# Patient Record
Sex: Male | Born: 1955 | Race: White | Hispanic: No | Marital: Married | Smoking: Former smoker
Health system: Southern US, Community
[De-identification: ages and names within clinical notes are randomized; demographics above are authoritative.]

## PROBLEM LIST (undated history)

## (undated) DIAGNOSIS — C801 Malignant (primary) neoplasm, unspecified: Secondary | ICD-10-CM

## (undated) DIAGNOSIS — R001 Bradycardia, unspecified: Secondary | ICD-10-CM

## (undated) HISTORY — PX: URETER SURGERY: SHX823

---

## 1998-06-24 ENCOUNTER — Observation Stay (HOSPITAL_COMMUNITY): Admission: AD | Admit: 1998-06-24 | Discharge: 1998-06-25 | Payer: Self-pay | Admitting: Urology

## 1998-07-06 ENCOUNTER — Inpatient Hospital Stay (HOSPITAL_COMMUNITY): Admission: RE | Admit: 1998-07-06 | Discharge: 1998-07-09 | Payer: Self-pay | Admitting: Urology

## 1998-09-15 ENCOUNTER — Encounter: Admission: RE | Admit: 1998-09-15 | Discharge: 1998-09-15 | Payer: Self-pay | Admitting: *Deleted

## 1998-12-29 ENCOUNTER — Encounter: Payer: Self-pay | Admitting: Urology

## 1998-12-29 ENCOUNTER — Ambulatory Visit (HOSPITAL_COMMUNITY): Admission: RE | Admit: 1998-12-29 | Discharge: 1998-12-29 | Payer: Self-pay | Admitting: Urology

## 2000-02-07 ENCOUNTER — Encounter: Payer: Self-pay | Admitting: Urology

## 2000-02-07 ENCOUNTER — Ambulatory Visit (HOSPITAL_COMMUNITY): Admission: RE | Admit: 2000-02-07 | Discharge: 2000-02-07 | Payer: Self-pay | Admitting: Urology

## 2001-10-24 ENCOUNTER — Inpatient Hospital Stay (HOSPITAL_COMMUNITY): Admission: RE | Admit: 2001-10-24 | Discharge: 2001-10-25 | Payer: Self-pay | Admitting: Neurosurgery

## 2003-12-19 ENCOUNTER — Ambulatory Visit (HOSPITAL_BASED_OUTPATIENT_CLINIC_OR_DEPARTMENT_OTHER): Admission: RE | Admit: 2003-12-19 | Discharge: 2003-12-19 | Payer: Self-pay | Admitting: Family Medicine

## 2004-02-22 ENCOUNTER — Emergency Department (HOSPITAL_COMMUNITY): Admission: EM | Admit: 2004-02-22 | Discharge: 2004-02-22 | Payer: Self-pay | Admitting: *Deleted

## 2008-08-27 ENCOUNTER — Emergency Department (HOSPITAL_COMMUNITY): Admission: EM | Admit: 2008-08-27 | Discharge: 2008-08-27 | Payer: Self-pay | Admitting: Emergency Medicine

## 2008-11-10 ENCOUNTER — Ambulatory Visit (HOSPITAL_COMMUNITY): Admission: RE | Admit: 2008-11-10 | Discharge: 2008-11-10 | Payer: Self-pay | Admitting: Family Medicine

## 2009-05-28 ENCOUNTER — Encounter: Admission: RE | Admit: 2009-05-28 | Discharge: 2009-05-28 | Payer: Self-pay | Admitting: Family Medicine

## 2011-04-01 NOTE — Op Note (Signed)
[place]  Patient:    Jacob Garcia, Jacob Garcia Visit Number: [id number] MRN: [id number]          Service Type: SUR Location: [id number] Attending Physician:  Cristi Loron Dictated by:   Cristi Loron, M.D. Proc. Date: 10/24/01 Admit Date:  10/24/2001                             Operative Report  PREOPERATIVE DIAGNOSIS:  C5-6 and C6-7 herniated nucleus pulposus, degenerative disc disease, spinal stenosis, cervicalgia, cervical radiculopathy.  POSTOPERATIVE DIAGNOSIS: C5-6 and C6-7 herniated nucleus pulposus, degenerative disc disease, spinal stenosis, cervicalgia, cervical radiculopathy.  OPERATION:  C5-6 and C6-7 extensive anterior cervical diskectomy, interbody iliac crest Allograft arthrodesis, anterior cervical plating (Codman titanium plate and screws).  SURGEON:  Cristi Loron, M.D.  ASSISTANT:  Stefani Dama, M.D.  ANESTHESIA:   General endotracheal.  ESTIMATED BLOOD LOSS:  150 cc  SPECIMENS:  None.  DRAINS:  None.  COMPLICATIONS:  None.  BRIEF HISTORY:  The patient is a 55 year old white male who has suffered from neck and right arm pain.  He failed medical management and was worked up with a cervical MRI which demonstrated a herniated disc at C5-6 and C6-7 on the right.  The patients signs, symptoms and physical exam were most consistent with a right C6 radiculopathy.  The patient therefore weighted the risks, benefits and alternatives of surgery and decided to proceed with an anterior cervical diskectomy, fusion and plating.  DESCRIPTION OF PROCEDURE:  The patient was brought to the operating room by the anesthesia team.  General endotracheal anesthesia was induced.  The patient remained in the supine position and a roll was placed under his shoulders.  This placed his neck in slight extension.  His anterior cervical region was then prepared with Betadine scrub and Betadine solution and sterile drapes  were applied.  I then injected the area to be incised with Marcaine with epinephrine solution.  I used the scalpel to make a left sided transverse incision in the patients anterior neck.  I used the Metzenbaum scissors to divide the platysmal muscle.  I then dissected medial to the sternocleidomastoid muscle, jugular vein and carotid artery.  I carefully bluntly dissected down towards the anterior cervical spine.  I carefully identified the esophagus and retracted it medially.  I then used the Kivett swabs to curet the soft tissue from the anterior cervical spine and inserted a bent spinal needle into the upper exposed interspace.  I obtained an intraoperative radiograph to confirm our location.  I then used electrocautery to detach the medial border of the longus colli muscle bilaterally from the C5-6 and C6-7 intervertebral disc space and inserted a Caspar self retaining retractor for exposure. I began at C5-6.  I incised the C5-6 intervertebral disc with a 15 blade scalpel and performed a partial diskectomy using the pituitary forceps and the Carlens curets.  I then inserted distraction screws at C5 and C6, distracted the interspace and then used the high speed drill to decorticate the vertebral end plates at A2-1 as well as drilled away the remainder of the intervertebral disc.  I thinned out the posterior longitudinal ligament with the drill.  I then incised the ligament the arachnoid knife and then removed it with a Kerrison punch undercutting the vertebral end plates, decompressing the thecal sac.  I then performed a foraminotomy about the bilateral C6 nerve root.  Of note, on the right there was a herniated disc which was clearly compressing the right C6 nerve root.  I got a good decompression of the thecal sac with the bilateral C6 nerve roots.  I then repeated this procedure at C6-7.  I removed the structures clear from C5 to C7, distracting the C6-7 interspace and incised the  intervertebral disc with the 15 blade scalpel and performed a partial diskectomy with pituitary forceps and Carlens curets.  I used the high speed drill to decorticate the vertebral end plates at Z6-1 and drill away the remainder of the C6-7 intervertebral disc as well as thinned out the cruciate longitudinal ligament. I incised the ligament with the arachnoid knife and then removed with Kerrison punch, undercutting the vertebral end plates, decompressing the thecal sac.  I then used the Kerrison punch to perform a foraminotomy about the bilateral C7 nerve root.  At this point, I had good decompression at C6-7 and good decompression of C7 nerve roots.  I now turned my attention to arthrodesis.  I obtained iliac crest tricortical Allograft bone grafts and fashioned them to these approximate dimensions: 7 mm in height, 1 cm in depth.  I inserted one bone graft in the C6-7 interspace and then removed distraction screw from C7 and placed it back in C5 to distract C5-6 interspace and placed the other bone graft in the C5-6 interspace.  I then removed the distraction screws from C5 and C6 and noted that the bone grafts had a good snug fit at both interspaces.  I now turned my attention to the anterior spine instrumentation. I obtained the appropriate length Codman anterior cervical plate, laid it along the anterior interspace of vertebral bodies from C5 down to C7.  I drilled two holes at C5, two at C6 and two at C7.  Secured the plate to the vertebral bodies with two 15 mm screws at each level.  I then obtained intraoperative radiographs.  Demonstrated good position of the plate, screws and interbody graft from C5 to C7.  I then secured the screws into the plate using the cam tightener at each screw.  I then copiously irrigated the wound with Bacitracin solution and removed the solution.  I achieved astringent hemostasis with bipolar electrocautery and Gelfoam.  I removed the Caspar self  retaining retractor and then inspected the esophagus for any damage.  There was none apparent.  I then reapproximated the patients platysmal muscle with  interrupted 3-0 Vicryl suture, subcutaneous tissue with interrupted 3-0 Vicryl suture and the skin with Steri-Strips with Benzoin.  The wound was then coated with Bacitracin ointment.  Sterile dressings were applied.  The drapes were removed and the patient was subsequently extubated by the anesthesia team and transported to the postanesthesia care unit in stable condition.  All sponge, instrument and needle counts were correct at the end of this case. Dictated by:   Cristi Loron, M.D. Attending Physician:  Tressie Stalker D DD:  10/24/01 TD:  10/25/01 Job: [id number]

## 2011-08-16 LAB — LIPASE, BLOOD: Lipase: 18

## 2011-08-16 LAB — CBC
Hemoglobin: 14.9
MCHC: 33.8
MCV: 91.5
RBC: 4.83
RDW: 13.2

## 2011-08-16 LAB — DIFFERENTIAL
Eosinophils Absolute: 0
Lymphocytes Relative: 7 — ABNORMAL LOW
Lymphs Abs: 0.9
Neutro Abs: 11.1 — ABNORMAL HIGH
Neutrophils Relative %: 88 — ABNORMAL HIGH

## 2011-08-16 LAB — URINALYSIS, ROUTINE W REFLEX MICROSCOPIC
Ketones, ur: NEGATIVE
Nitrite: NEGATIVE
Specific Gravity, Urine: 1.022
pH: 6

## 2011-08-16 LAB — COMPREHENSIVE METABOLIC PANEL
BUN: 9
CO2: 23
Calcium: 8.8
Creatinine, Ser: 1.12
GFR calc non Af Amer: >60
Glucose, Bld: 108 — ABNORMAL HIGH
Total Protein: 6.4

## 2012-04-05 ENCOUNTER — Other Ambulatory Visit: Payer: Self-pay | Admitting: Family Medicine

## 2012-04-05 DIAGNOSIS — E049 Nontoxic goiter, unspecified: Secondary | ICD-10-CM

## 2012-04-06 ENCOUNTER — Ambulatory Visit
Admission: RE | Admit: 2012-04-06 | Discharge: 2012-04-06 | Disposition: A | Discharge status: Home / Self Care | Payer: BC Managed Care – PPO | Source: Ambulatory Visit | Attending: Family Medicine | Admitting: Family Medicine

## 2012-04-06 DIAGNOSIS — E049 Nontoxic goiter, unspecified: Secondary | ICD-10-CM

## 2013-08-25 ENCOUNTER — Emergency Department (HOSPITAL_COMMUNITY): Payer: BC Managed Care – PPO

## 2013-08-25 ENCOUNTER — Encounter (HOSPITAL_COMMUNITY): Payer: Self-pay | Admitting: Emergency Medicine

## 2013-08-25 ENCOUNTER — Emergency Department (HOSPITAL_COMMUNITY)
Admission: EM | Admit: 2013-08-25 | Discharge: 2013-08-25 | Disposition: A | Discharge status: Home / Self Care | Payer: BC Managed Care – PPO | Attending: Emergency Medicine | Admitting: Emergency Medicine

## 2013-08-25 DIAGNOSIS — F17200 Nicotine dependence, unspecified, uncomplicated: Secondary | ICD-10-CM | POA: Insufficient documentation

## 2013-08-25 DIAGNOSIS — N23 Unspecified renal colic: Secondary | ICD-10-CM | POA: Insufficient documentation

## 2013-08-25 DIAGNOSIS — Z8679 Personal history of other diseases of the circulatory system: Secondary | ICD-10-CM | POA: Insufficient documentation

## 2013-08-25 DIAGNOSIS — Z8551 Personal history of malignant neoplasm of bladder: Secondary | ICD-10-CM | POA: Insufficient documentation

## 2013-08-25 DIAGNOSIS — Z880 Allergy status to penicillin: Secondary | ICD-10-CM | POA: Insufficient documentation

## 2013-08-25 HISTORY — DX: Bradycardia, unspecified: R00.1

## 2013-08-25 HISTORY — DX: Malignant (primary) neoplasm, unspecified: C80.1

## 2013-08-25 LAB — URINALYSIS, ROUTINE W REFLEX MICROSCOPIC
Bilirubin Urine: NEGATIVE
Nitrite: NEGATIVE
Specific Gravity, Urine: 1.02 (ref 1.005–1.030)
Urobilinogen, UA: 0.2 mg/dL (ref 0.0–1.0)

## 2013-08-25 LAB — URINE MICROSCOPIC-ADD ON

## 2013-08-25 MED ORDER — KETOROLAC TROMETHAMINE 60 MG/2ML IM SOLN
60.0000 mg | Freq: Once | INTRAMUSCULAR | Status: AC
  Administered 2013-08-25: 60 mg via INTRAMUSCULAR
  Filled 2013-08-25: qty 2

## 2013-08-25 MED ORDER — TAMSULOSIN HCL 0.4 MG PO CAPS: 0.4000 mg | ORAL_CAPSULE | Freq: Every day | ORAL | Status: AC

## 2013-08-25 MED ORDER — OXYCODONE-ACETAMINOPHEN 5-325 MG PO TABS: 2.0000 | ORAL_TABLET | ORAL | Status: DC | PRN

## 2013-08-25 NOTE — ED Provider Notes (Signed)
CSN: [id number]     Arrival date & time 08/25/13  0920 History   First MD Initiated Contact with Patient 08/25/13 [phone]     Chief Complaint  Patient presents with  . Flank Pain   (Consider location/radiation/quality/duration/timing/severity/associated sxs/prior Treatment) Patient is a 57 y.o. male presenting with flank pain. The history is provided by the patient.  Flank Pain   patient here complaining of left-sided flank pain which started this morning. Pain characterized as colicky and radiating to his left groin without testicular pain or swelling. No dysuria possible hematuria a month ago but none now. No fever or chills. No nausea vomiting. No medications used prior to arrival. Symptoms resolved spontaneously. No history of recent trauma.  Past Medical History  Diagnosis Date  . Bradycardia   . Cancer     bladder   Past Surgical History  Procedure Laterality Date  . Ureter surgery     History reviewed. No pertinent family history. History  Substance Use Topics  . Smoking status: Current Every Day Smoker -- 0.50 packs/day    Types: Cigarettes  . Smokeless tobacco: Never Used  . Alcohol Use: No    Review of Systems  Genitourinary: Positive for flank pain.  All other systems reviewed and are negative.    Allergies  Penicillins  Home Medications  No current outpatient prescriptions on file. BP 127/67  Pulse 53  Temp(Src) 97.6 F (36.4 C) (Oral)  Resp 20  SpO2 100% Physical Exam  Nursing note and vitals reviewed. Constitutional: He is oriented to person, place, and time. He appears well-developed and well-nourished.  Non-toxic appearance. No distress.  HENT:  Head: Normocephalic and atraumatic.  Eyes: Conjunctivae, EOM and lids are normal. Pupils are equal, round, and reactive to light.  Neck: Normal range of motion. Neck supple. No tracheal deviation present. No mass present.  Cardiovascular: Normal rate, regular rhythm and normal heart sounds.  Exam reveals  no gallop.   No murmur heard. Pulmonary/Chest: Effort normal and breath sounds normal. No stridor. No respiratory distress. He has no decreased breath sounds. He has no wheezes. He has no rhonchi. He has no rales.  Abdominal: Soft. Normal appearance and bowel sounds are normal. He exhibits no distension. There is no tenderness. There is no rebound and no CVA tenderness.  Musculoskeletal: Normal range of motion. He exhibits no edema and no tenderness.  Neurological: He is alert and oriented to person, place, and time. He has normal strength. No cranial nerve deficit or sensory deficit. GCS eye subscore is 4. GCS verbal subscore is 5. GCS motor subscore is 6.  Skin: Skin is warm and dry. No abrasion and no rash noted.  Psychiatric: He has a normal mood and affect. His speech is normal and behavior is normal.    ED Course  Procedures (including critical care time) Labs Review Labs Reviewed  URINALYSIS, ROUTINE W REFLEX MICROSCOPIC   Imaging Review No results found.  EKG Interpretation   None       MDM  No diagnosis found.  Patient Toradol here and feels better. Will be given referral to urology on call and pain meds.   Toy Baker, MD 08/25/13 1113

## 2013-08-25 NOTE — ED Notes (Signed)
Pt c/o L flank pain radiating to L abdomen starting at 0830.  Pain score 8/10.  St possible hematuria x 1 month ago, which has resolved.  Denies dysuria.

## 2015-11-26 ENCOUNTER — Other Ambulatory Visit: Payer: Self-pay | Admitting: Occupational Medicine

## 2015-11-26 DIAGNOSIS — M25511 Pain in right shoulder: Secondary | ICD-10-CM

## 2015-12-04 ENCOUNTER — Other Ambulatory Visit: Payer: Self-pay

## 2016-10-19 ENCOUNTER — Emergency Department (HOSPITAL_COMMUNITY): Payer: 59

## 2016-10-19 ENCOUNTER — Encounter (HOSPITAL_COMMUNITY): Payer: Self-pay

## 2016-10-19 ENCOUNTER — Emergency Department (HOSPITAL_COMMUNITY)
Admission: EM | Admit: 2016-10-19 | Discharge: 2016-10-19 | Disposition: A | Discharge status: Home / Self Care | Payer: 59 | Attending: Emergency Medicine | Admitting: Emergency Medicine

## 2016-10-19 DIAGNOSIS — Z8551 Personal history of malignant neoplasm of bladder: Secondary | ICD-10-CM | POA: Diagnosis not present

## 2016-10-19 DIAGNOSIS — R109 Unspecified abdominal pain: Secondary | ICD-10-CM

## 2016-10-19 DIAGNOSIS — F17210 Nicotine dependence, cigarettes, uncomplicated: Secondary | ICD-10-CM | POA: Insufficient documentation

## 2016-10-19 DIAGNOSIS — N132 Hydronephrosis with renal and ureteral calculous obstruction: Secondary | ICD-10-CM | POA: Diagnosis not present

## 2016-10-19 DIAGNOSIS — N50819 Testicular pain, unspecified: Secondary | ICD-10-CM

## 2016-10-19 DIAGNOSIS — Z79899 Other long term (current) drug therapy: Secondary | ICD-10-CM | POA: Insufficient documentation

## 2016-10-19 DIAGNOSIS — Z87442 Personal history of urinary calculi: Secondary | ICD-10-CM

## 2016-10-19 DIAGNOSIS — N200 Calculus of kidney: Secondary | ICD-10-CM

## 2016-10-19 LAB — CBC WITH DIFFERENTIAL/PLATELET
BASOS ABS: 0 10*3/uL (ref 0.0–0.1)
BASOS PCT: 0 %
EOS ABS: 0.1 10*3/uL (ref 0.0–0.7)
Eosinophils Relative: 1 %
HEMATOCRIT: 48.4 % (ref 39.0–52.0)
HEMOGLOBIN: 16.3 g/dL (ref 13.0–17.0)
Lymphocytes Relative: 11 %
Lymphs Abs: 1.4 10*3/uL (ref 0.7–4.0)
MCH: 31 pg (ref 26.0–34.0)
MCHC: 33.7 g/dL (ref 30.0–36.0)
MCV: 92.2 fL (ref 78.0–100.0)
Monocytes Absolute: 0.6 10*3/uL (ref 0.1–1.0)
Monocytes Relative: 5 %
NEUTROS ABS: 10.5 10*3/uL — AB (ref 1.7–7.7)
NEUTROS PCT: 83 %
Platelets: 206 10*3/uL (ref 150–400)
RBC: 5.25 MIL/uL (ref 4.22–5.81)
RDW: 13.4 % (ref 11.5–15.5)
WBC: 12.6 10*3/uL — AB (ref 4.0–10.5)

## 2016-10-19 LAB — BASIC METABOLIC PANEL
ANION GAP: 9 (ref 5–15)
BUN: 15 mg/dL (ref 6–20)
CALCIUM: 9.3 mg/dL (ref 8.9–10.3)
CHLORIDE: 106 mmol/L (ref 101–111)
CO2: 24 mmol/L (ref 22–32)
CREATININE: 1.46 mg/dL — AB (ref 0.61–1.24)
GFR calc non Af Amer: 51 mL/min — ABNORMAL LOW (ref >60)
GFR, EST AFRICAN AMERICAN: 59 mL/min — AB (ref >60)
Glucose, Bld: 151 mg/dL — ABNORMAL HIGH (ref 65–99)
Potassium: 3.6 mmol/L (ref 3.5–5.1)
SODIUM: 139 mmol/L (ref 135–145)

## 2016-10-19 LAB — URINALYSIS, ROUTINE W REFLEX MICROSCOPIC
BILIRUBIN URINE: NEGATIVE
Glucose, UA: NEGATIVE mg/dL
KETONES UR: 5 mg/dL — AB
Leukocytes, UA: NEGATIVE
Nitrite: NEGATIVE
PROTEIN: 30 mg/dL — AB
Specific Gravity, Urine: 1.019 (ref 1.005–1.030)
pH: 5 (ref 5.0–8.0)

## 2016-10-19 MED ORDER — MORPHINE SULFATE (PF) 4 MG/ML IV SOLN
4.0000 mg | Freq: Once | INTRAVENOUS | Status: AC
  Administered 2016-10-19: 4 mg via INTRAVENOUS
  Filled 2016-10-19: qty 1

## 2016-10-19 MED ORDER — HYDROMORPHONE HCL 1 MG/ML IJ SOLN
1.0000 mg | Freq: Once | INTRAMUSCULAR | Status: AC
  Administered 2016-10-19: 1 mg via INTRAVENOUS
  Filled 2016-10-19: qty 1

## 2016-10-19 MED ORDER — ONDANSETRON HCL 4 MG/2ML IJ SOLN
4.0000 mg | Freq: Once | INTRAMUSCULAR | Status: AC
  Administered 2016-10-19: 4 mg via INTRAVENOUS
  Filled 2016-10-19: qty 2

## 2016-10-19 MED ORDER — OXYCODONE-ACETAMINOPHEN 5-325 MG PO TABS: 1.0000 | ORAL_TABLET | Freq: Four times a day (QID) | ORAL | 0 refills | Status: AC | PRN

## 2016-10-19 MED ORDER — FENTANYL CITRATE (PF) 100 MCG/2ML IJ SOLN
50.0000 ug | INTRAMUSCULAR | Status: DC | PRN
  Administered 2016-10-19: 50 ug via NASAL
  Filled 2016-10-19: qty 2

## 2016-10-19 NOTE — ED Provider Notes (Signed)
[place] DEPT Provider Note   CSN: [id number] Arrival date & time: 10/19/16  0106     History   Chief Complaint Chief Complaint  Patient presents with  . Flank Pain    HPI Jacob Garcia is a 60 y.o. male.  Patient presents emergency department with chief complaint of left-sided flank and abdominal pain. Patient reports that the symptoms started suddenly this evening around 10 PM. He reports associated nausea, but denies any vomiting. He denies any fevers or chills. He states that the pain radiates to his left testicle. He reports having history of kidney stones, but states that this is significantly worse. He denies any dysuria, hematuria, or penile discharge. He has not taken anything for her symptoms. His symptoms are worsened with movement and palpation.   The history is provided by the patient. No language interpreter was used.    Past Medical History:  Diagnosis Date  . Bradycardia   . Cancer [facility])    bladder    There are no active problems to display for this patient.   Past Surgical History:  Procedure Laterality Date  . URETER SURGERY         Home Medications    Prior to Admission medications   Medication Sig Start Date End Date Taking? Authorizing Provider  ibuprofen (ADVIL,MOTRIN) 200 MG tablet Take 400 mg by mouth every 6 (six) hours as needed for pain.   Yes Historical Provider, MD  oxyCODONE-acetaminophen (PERCOCET/ROXICET) 5-325 MG per tablet Take 2 tablets by mouth every 4 (four) hours as needed for pain. Patient not taking: Reported on 10/19/2016 08/25/13   Lacretia Leigh, MD  tamsulosin (FLOMAX) 0.4 MG CAPS capsule Take 1 capsule (0.4 mg total) by mouth daily. Patient not taking: Reported on 10/19/2016 08/25/13   Lacretia Leigh, MD    Family History History reviewed. No pertinent family history.  Social History Social History  Substance Use Topics  . Smoking status: Current Every Day Smoker    Packs/day: 0.50    Types: Cigarettes  .  Smokeless tobacco: Never Used  . Alcohol use No     Allergies   Penicillins   Review of Systems Review of Systems  Constitutional: Negative for fever.  Gastrointestinal: Positive for abdominal pain.  Genitourinary: Positive for flank pain.  All other systems reviewed and are negative.    Physical Exam Updated Vital Signs BP 139/92   Pulse (!) 52   Temp 97.5 F (36.4 C) (Oral)   Resp 20   Ht 6\' 2"  (1.88 m)   Wt 93 kg   SpO2 100%   BMI 26.32 kg/m   Physical Exam  Constitutional: He is oriented to person, place, and time. He appears well-developed and well-nourished.  HENT:  Head: Normocephalic and atraumatic.  Eyes: Conjunctivae and EOM are normal. Pupils are equal, round, and reactive to light. Right eye exhibits no discharge. Left eye exhibits no discharge. No scleral icterus.  Neck: Normal range of motion. Neck supple. No JVD present.  Cardiovascular: Normal rate, regular rhythm and normal heart sounds.  Exam reveals no gallop and no friction rub.   No murmur heard. Pulmonary/Chest: Effort normal and breath sounds normal. No respiratory distress. He has no wheezes. He has no rales. He exhibits no tenderness.  Abdominal: Soft. He exhibits no distension and no mass. There is tenderness. There is no rebound and no guarding.  Left lower abdomen tenderness palpation, no other focal abdominal tenderness  Genitourinary: Penis normal.  Genitourinary Comments: Circumcised male, left testicle is  tender to palpation  Musculoskeletal: Normal range of motion. He exhibits no edema or tenderness.  Neurological: He is alert and oriented to person, place, and time.  Skin: Skin is warm and dry.  Psychiatric: He has a normal mood and affect. His behavior is normal. Judgment and thought content normal.  Nursing note and vitals reviewed.    ED Treatments / Results  Labs (all labs ordered are listed, but only abnormal results are displayed) Labs Reviewed  URINALYSIS, ROUTINE W  REFLEX MICROSCOPIC - Abnormal; Notable for the following:       Result Value   APPearance HAZY (*)    Hgb urine dipstick LARGE (*)    Ketones, ur 5 (*)    Protein, ur 30 (*)    Bacteria, UA RARE (*)    Squamous Epithelial / LPF 0-5 (*)    All other components within normal limits  CBC WITH DIFFERENTIAL/PLATELET - Abnormal; Notable for the following:    WBC 12.6 (*)    Neutro Abs 10.5 (*)    All other components within normal limits  BASIC METABOLIC PANEL - Abnormal; Notable for the following:    Glucose, Bld 151 (*)    Creatinine, Ser 1.46 (*)    GFR calc non Af Amer 51 (*)    GFR calc Af Amer 59 (*)    All other components within normal limits    EKG  EKG Interpretation None       Radiology US Scrotum  Result Date: 10/19/2016 CLINICAL DATA:  Left flank pain EXAM: SCROTAL ULTRASOUND DOPPLER ULTRASOUND OF THE TESTICLES TECHNIQUE: Complete ultrasound examination of the testicles, epididymis, and other scrotal structures was performed. Color and spectral Doppler ultrasound were also utilized to evaluate blood flow to the testicles. COMPARISON:  None. FINDINGS: Right testicle Measurements: 4.9 x 2.8 x 3.4 cm. No mass or microlithiasis visualized. Left testicle Measurements: 4.3 x 2.7 x 2.9 cm. No mass or microlithiasis visualized. Right epididymis:  Normal in size and appearance. Left epididymis:  Normal in size and appearance. Hydrocele:  Small hydroceles bilaterally. Varicocele:  None visualized. Pulsed Doppler interrogation of both testes demonstrates normal low resistance arterial and venous waveforms bilaterally. IMPRESSION: Small hydroceles bilaterally. Otherwise normal scrotal contents. No testicular mass or torsion. Electronically Signed   By: Andreas Newport M.D.   On: 10/19/2016 03:16   [place] Art/ven Flow Abd Pelv Doppler  Result Date: 10/19/2016 CLINICAL DATA:  Left flank pain EXAM: SCROTAL ULTRASOUND DOPPLER ULTRASOUND OF THE TESTICLES TECHNIQUE: Complete ultrasound  examination of the testicles, epididymis, and other scrotal structures was performed. Color and spectral Doppler ultrasound were also utilized to evaluate blood flow to the testicles. COMPARISON:  None. FINDINGS: Right testicle Measurements: 4.9 x 2.8 x 3.4 cm. No mass or microlithiasis visualized. Left testicle Measurements: 4.3 x 2.7 x 2.9 cm. No mass or microlithiasis visualized. Right epididymis:  Normal in size and appearance. Left epididymis:  Normal in size and appearance. Hydrocele:  Small hydroceles bilaterally. Varicocele:  None visualized. Pulsed Doppler interrogation of both testes demonstrates normal low resistance arterial and venous waveforms bilaterally. IMPRESSION: Small hydroceles bilaterally. Otherwise normal scrotal contents. No testicular mass or torsion. Electronically Signed   By: Andreas Newport M.D.   On: 10/19/2016 03:16   Ct Renal Stone Study  Result Date: 10/19/2016 CLINICAL DATA:  Left flank pain. EXAM: CT ABDOMEN AND PELVIS WITHOUT CONTRAST TECHNIQUE: Multidetector CT imaging of the abdomen and pelvis was performed following the standard protocol without IV contrast. COMPARISON:  None. FINDINGS:  Lower chest: Atelectatic appearing opacities in both lung bases. No confluent consolidation. No effusions. Hepatobiliary: No focal liver abnormality is seen. No gallstones, gallbladder wall thickening, or biliary dilatation. Pancreas: Unremarkable. No pancreatic ductal dilatation or surrounding inflammatory changes. Spleen: Normal in size without focal abnormality. Adrenals/Urinary Tract: There is an obstructing 3 x 4 mm calculus in the distal left ureter 5 cm proximal to the ureterovesical junction. Marked hydronephrosis and perinephric stranding. Right kidney and ureter are unremarkable. Urinary bladder is unremarkable. Stomach/Bowel: Stomach is within normal limits. Appendix appears normal. No evidence of bowel wall thickening, distention, or inflammatory changes. Vascular/Lymphatic:  The abdominal aorta is normal in caliber with moderate atherosclerotic calcification. There is no adenopathy in the abdomen or pelvis. Reproductive: Unremarkable Other: No ascites. Musculoskeletal: No significant skeletal lesion. IMPRESSION: 1. Obstructing 3 x 4 mm distal left ureteral calculus, 5 cm proximal to the UVJ. Marked hydronephrosis. 2. Aortic atherosclerosis. 3. Atelectatic appearing opacities in both lung bases. Electronically Signed   By: Andreas Newport M.D.   On: 10/19/2016 04:10    Procedures Procedures (including critical care time)  Medications Ordered in ED Medications  fentaNYL (SUBLIMAZE) injection 50 mcg (50 mcg Nasal Given 10/19/16 0135)  HYDROmorphone (DILAUDID) injection 1 mg (not administered)  ondansetron (ZOFRAN) injection 4 mg (not administered)     Initial Impression / Assessment and Plan / ED Course  I have reviewed the triage vital signs and the nursing notes.  Pertinent labs & imaging results that were available during my care of the patient were reviewed by me and considered in my medical decision making (see chart for details).  Clinical Course     Patient with left sided flank/abdominal/testicle pain. Sudden onset this evening at 10 PM. We'll treat pain, check CT renal stone study as well as scrotal ultrasound given the significant left testicular tenderness, will need to rule out torsion. Suspect kidney stone.  No evidence of testicular torsion on ultrasound.  CT scan consistent with kidney stone. Urinalysis negative for infection. Will send for culture. Patient's pain is well-controlled. Urology follow-up in the next week. Return precautions given. Patient understands agrees with the plan. He is stable and ready for discharge.  Final Clinical Impressions(s) / ED Diagnoses   Final diagnoses:  Kidney stone    New Prescriptions Current Discharge Medication List       Montine Circle, PA-C 10/19/16 0500    April Palumbo, MD 10/19/16  [phone]

## 2016-10-19 NOTE — ED Triage Notes (Signed)
Pt reports L sided flank pain x 2-3 hrs. Pt has a history of kidney stones and reports similar presentation. Endorses urinary hesitancy and nausea. Denies hematuria. A&Ox4. Ambulatory (hunched over).

## 2016-10-19 NOTE — ED Notes (Signed)
Patient aware urine sample is needed.  

## 2016-10-20 LAB — URINE CULTURE: CULTURE: NO GROWTH

## 2017-05-12 IMAGING — US US ART/VEN ABD/PELV/SCROTUM DOPPLER LTD
1 series · 14 of 25 positions shown · non-contrast
Comparison: None.

CLINICAL DATA: Left flank pain

EXAM:
SCROTAL ULTRASOUND
DOPPLER ULTRASOUND OF THE TESTICLES
TECHNIQUE: Complete ultrasound examination of the testicles, epididymis, and
other scrotal structures was performed. Color and spectral Doppler
ultrasound were also utilized to evaluate blood flow to the
testicles.

[Series 1: us art/ven abd/pelv/scrotum doppler ltd · 0.06mm/px · 14 of 58 slices shown]
[im 1/58]
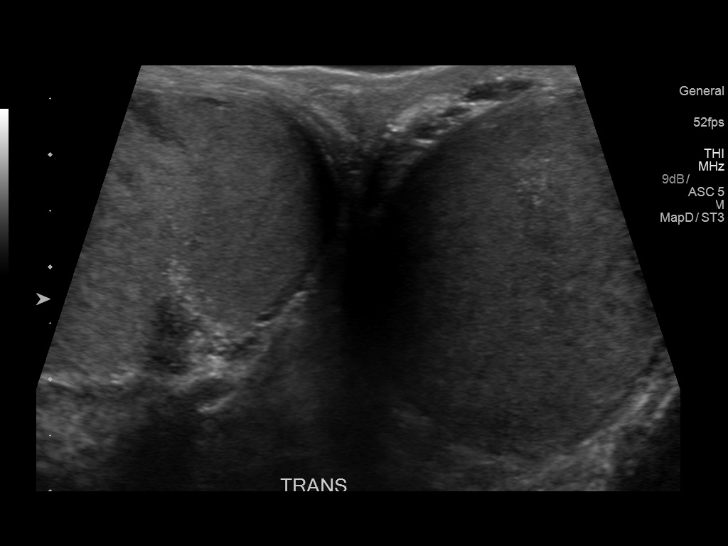
[im 5/58]
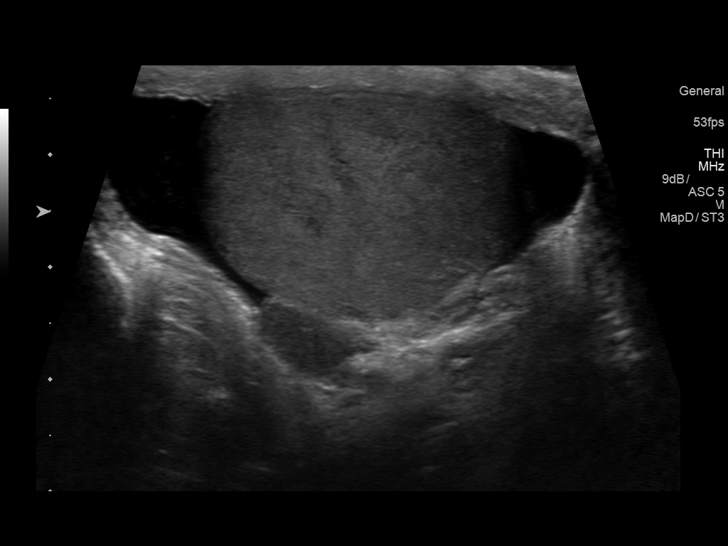
[im 10/58]
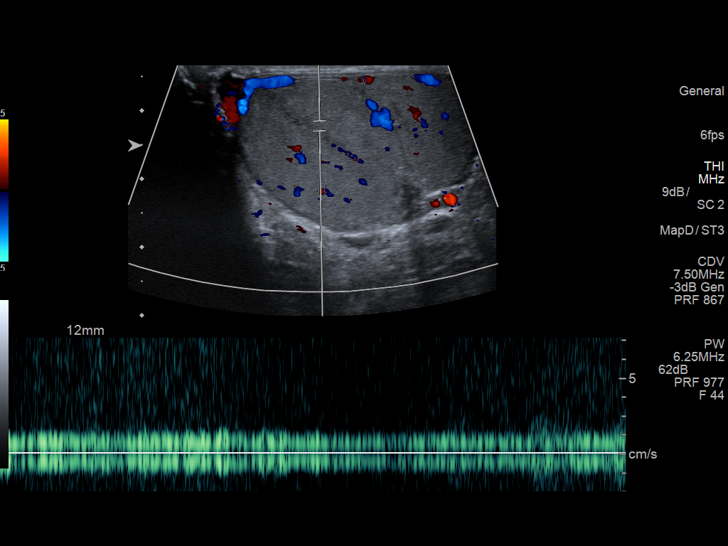
[im 15/58]
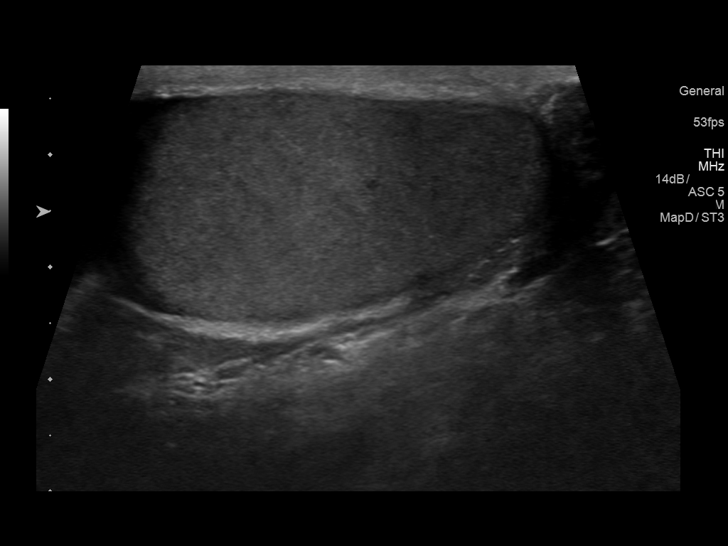
[im 20/58]
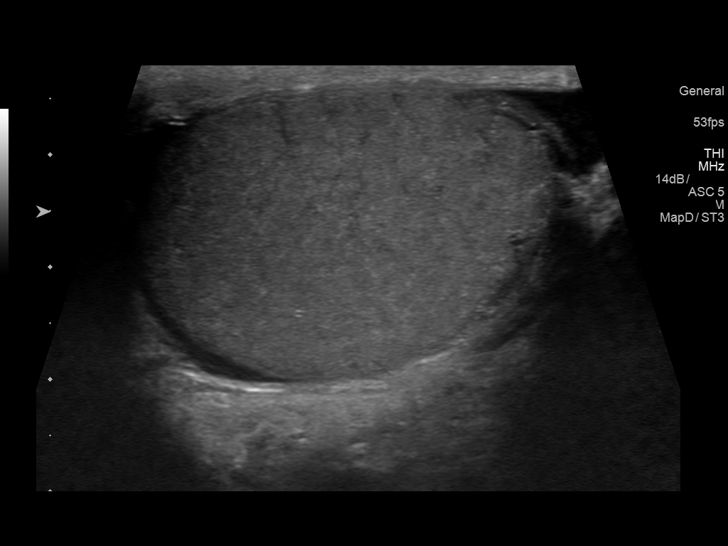
[im 22/58]
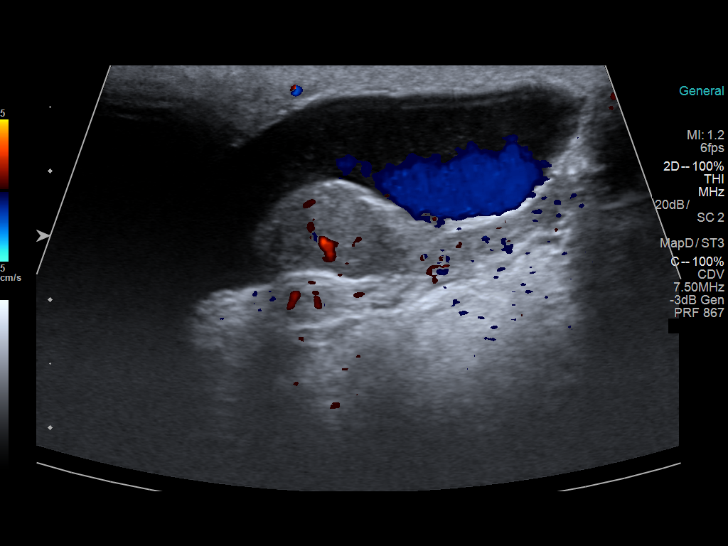
[im 27/58]
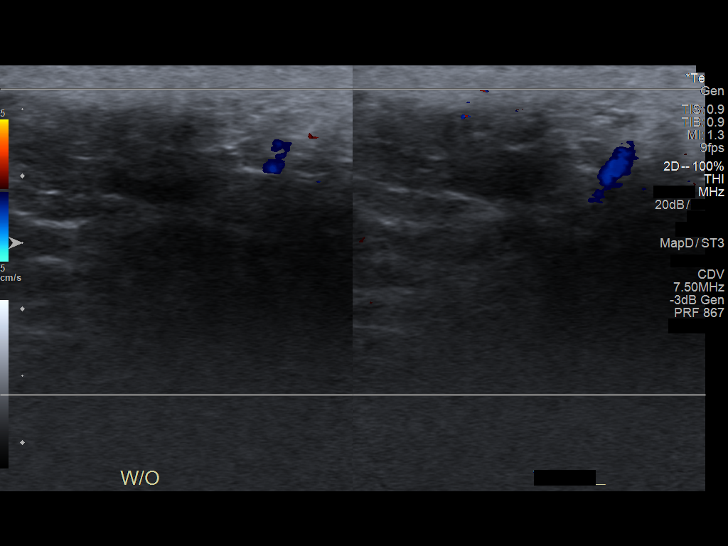
[im 31/58]
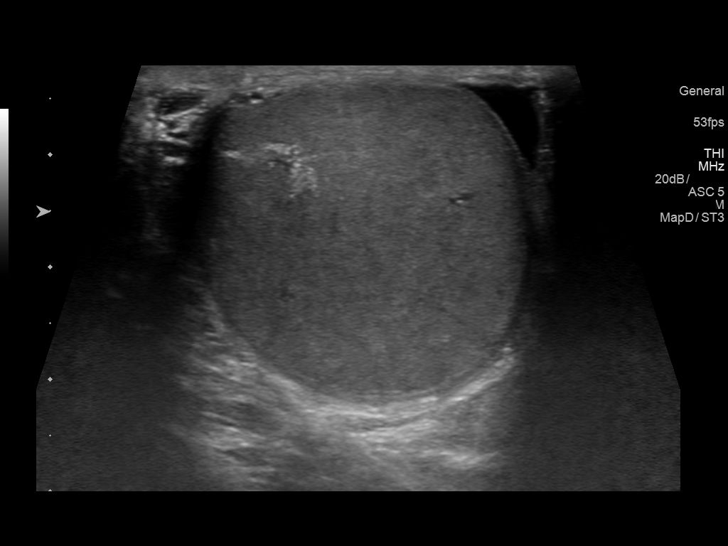
[im 36/58]
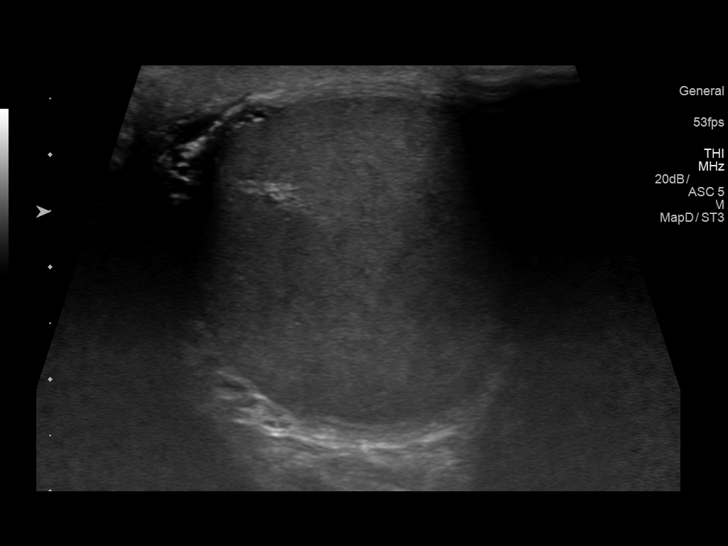
[im 39/58]
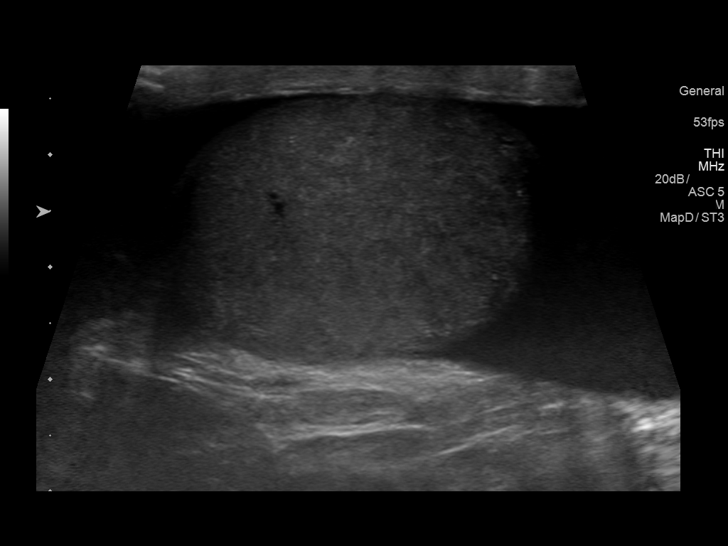
[im 43/58]
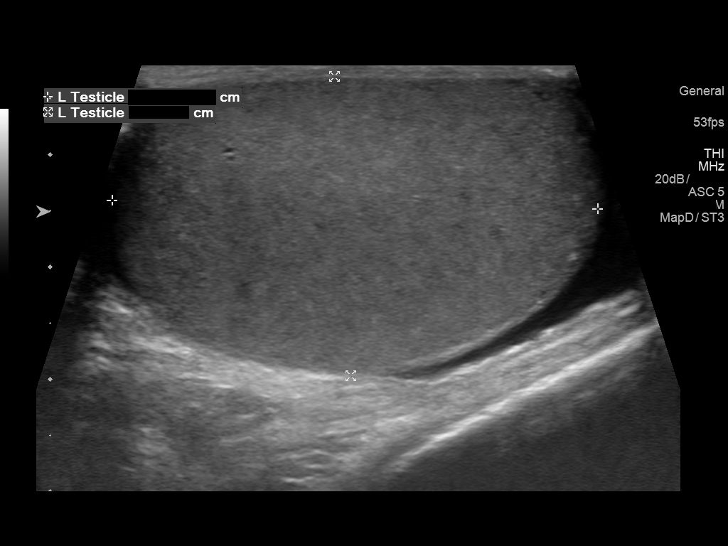
[im 48/58]
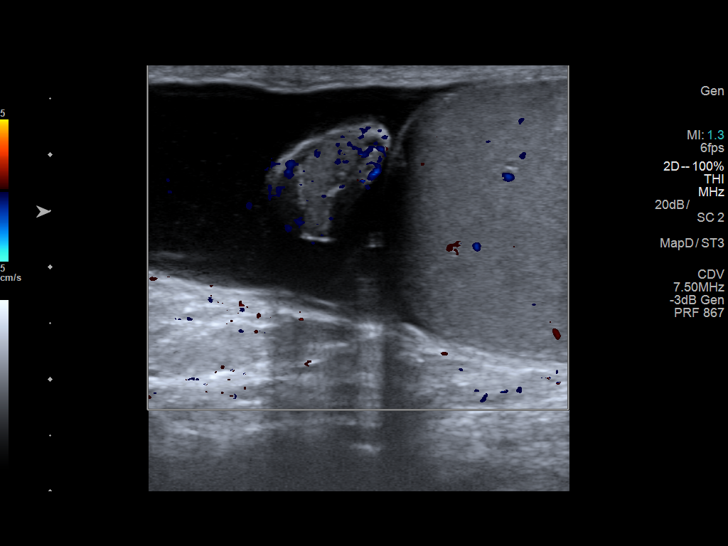
[im 53/58]
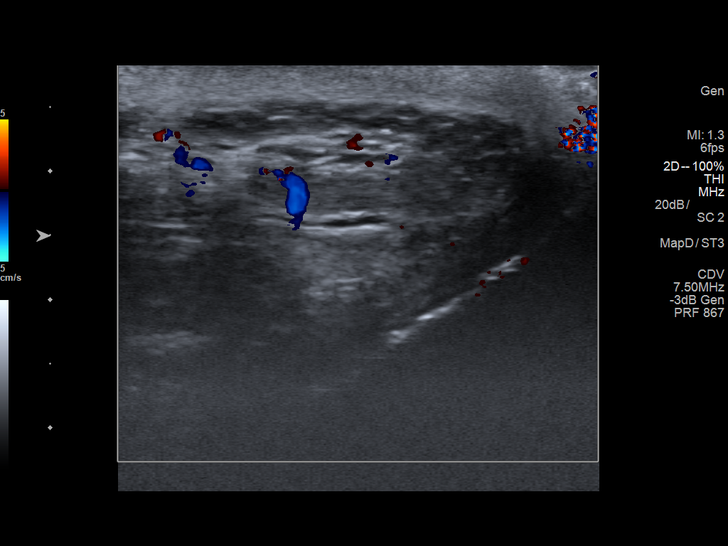
[im 58/58]
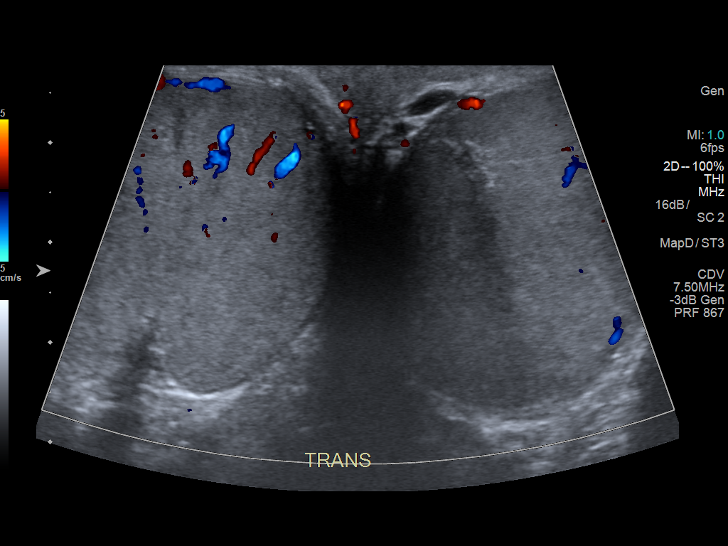

[14 of 25 positions shown; findings below may reference images not displayed]

FINDINGS: Right testicle

Measurements: 4.9 x 2.8 x 3.4 cm. No mass or microlithiasis
visualized.

Left testicle

Measurements: 4.3 x 2.7 x 2.9 cm. No mass or microlithiasis
visualized.

Right epididymis:  Normal in size and appearance.

Left epididymis:  Normal in size and appearance.

Hydrocele:  Small hydroceles bilaterally.

Varicocele:  None visualized.

Pulsed Doppler interrogation of both testes demonstrates normal low
resistance arterial and venous waveforms bilaterally.
IMPRESSION: Small hydroceles bilaterally. Otherwise normal scrotal contents. No
testicular mass or torsion.

## 2019-01-30 ENCOUNTER — Other Ambulatory Visit: Payer: Self-pay | Admitting: Family Medicine

## 2019-01-30 DIAGNOSIS — R748 Abnormal levels of other serum enzymes: Secondary | ICD-10-CM

## 2019-02-04 ENCOUNTER — Other Ambulatory Visit: Payer: Self-pay

## 2019-02-04 ENCOUNTER — Other Ambulatory Visit: Payer: 59

## 2019-02-04 ENCOUNTER — Ambulatory Visit
Admission: RE | Admit: 2019-02-04 | Discharge: 2019-02-04 | Disposition: A | Discharge status: Home / Self Care | Payer: Self-pay | Source: Ambulatory Visit | Attending: Family Medicine | Admitting: Family Medicine

## 2019-02-04 ENCOUNTER — Other Ambulatory Visit: Payer: Self-pay | Admitting: Family Medicine

## 2019-02-04 DIAGNOSIS — R748 Abnormal levels of other serum enzymes: Secondary | ICD-10-CM

## 2019-08-28 IMAGING — US ULTRASOUND ABDOMEN COMPLETE
1 series · 14 of 25 positions shown · non-contrast
Comparison: CT 10/19/2016

CLINICAL DATA: Elevated LFTs

EXAM:
COMPLETE ABDOMINAL ULTRASOUND

[Series 1: ultrasound abdomen complete · 0.26mm/px · 14 of 110 slices shown]
[im 1/110]
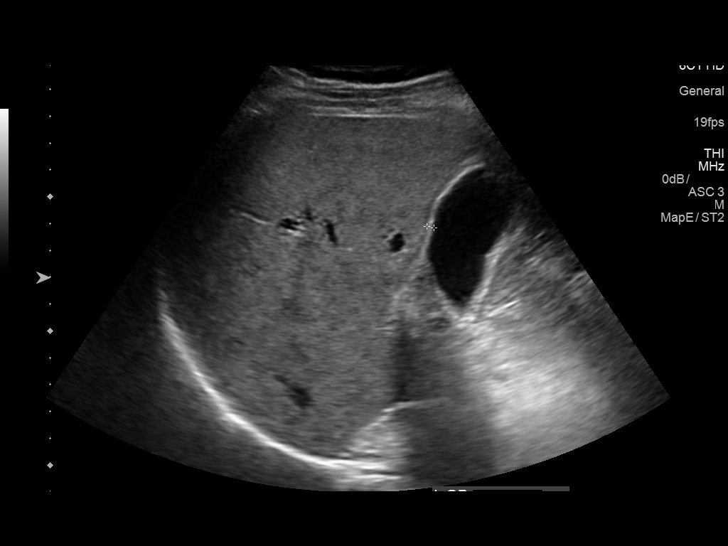
[im 10/110]
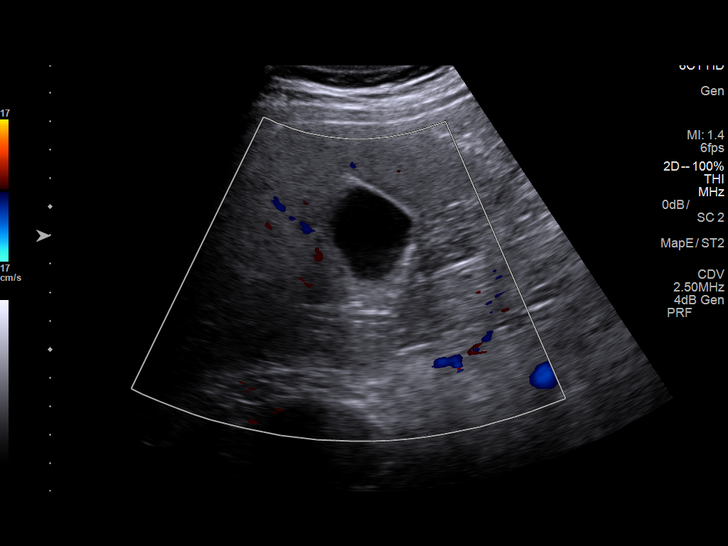
[im 19/110]
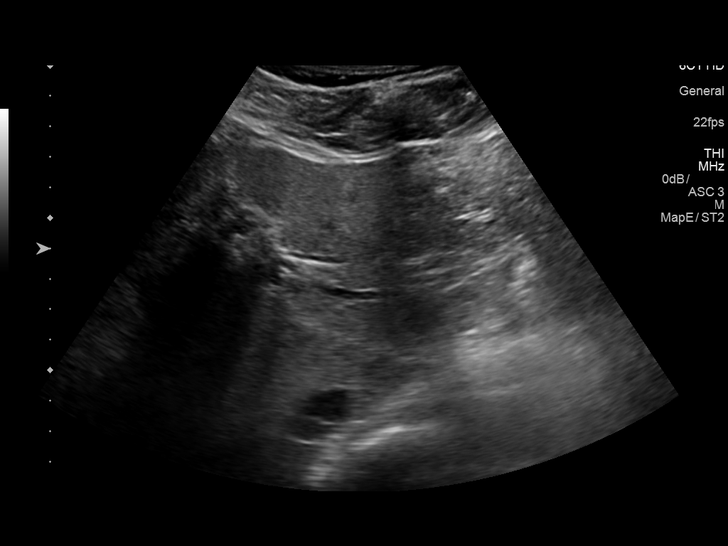
[im 28/110]
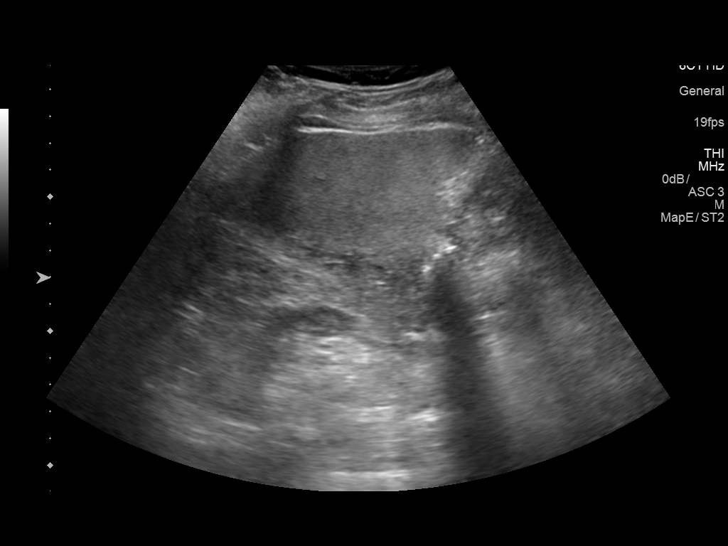
[im 37/110]
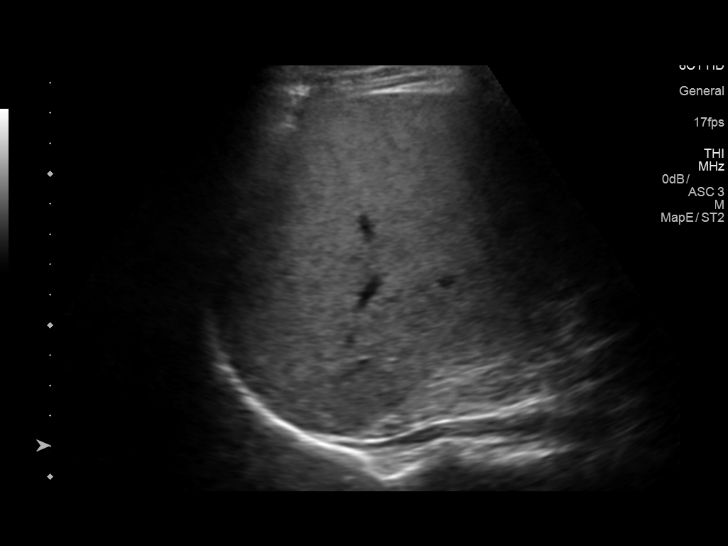
[im 41/110]
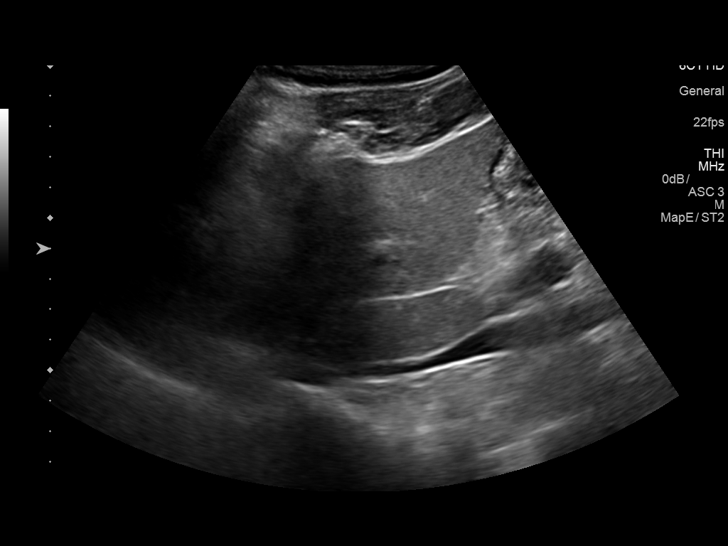
[im 50/110]
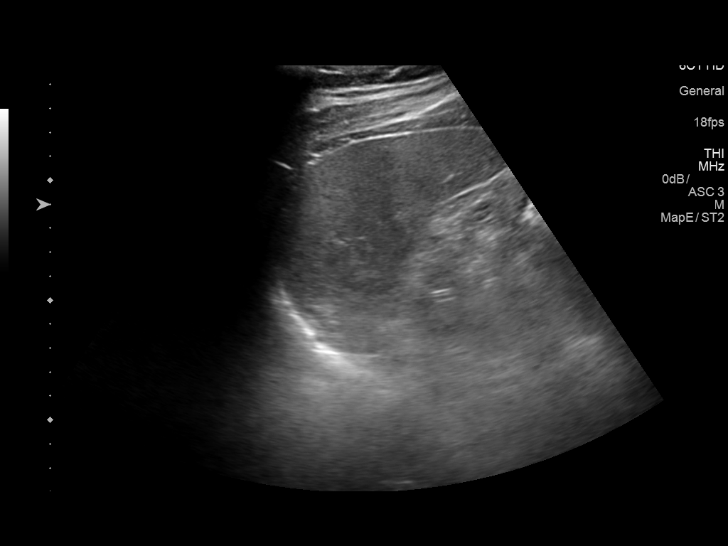
[im 60/110]
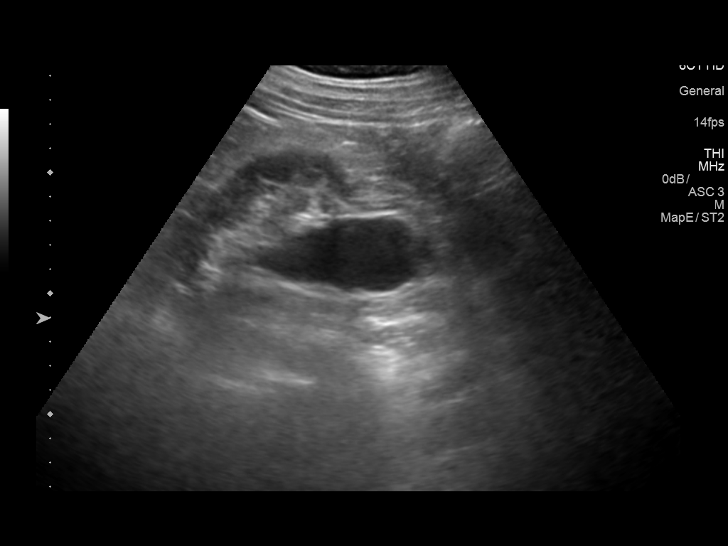
[im 69/110]
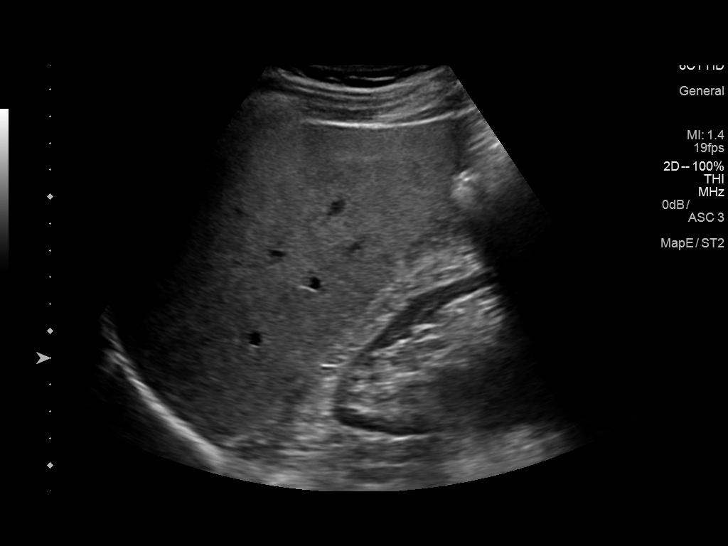
[im 73/110]
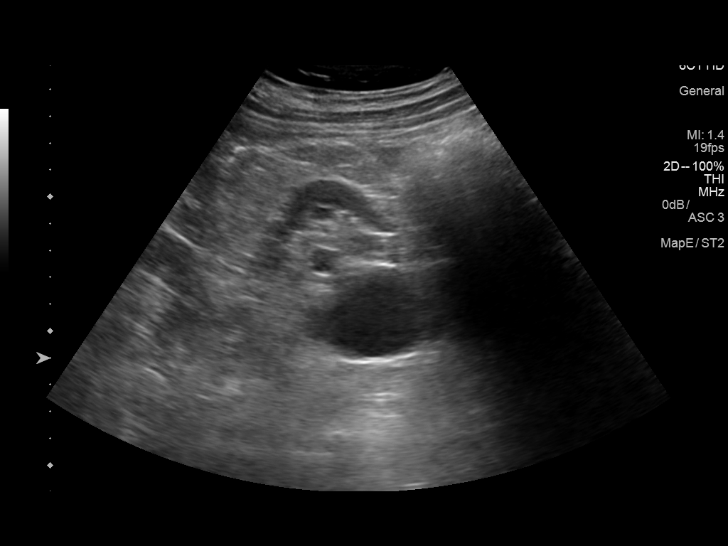
[im 82/110]
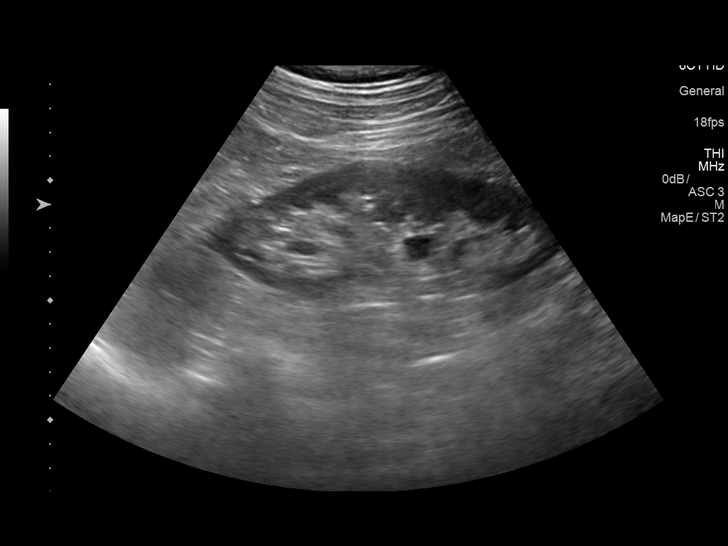
[im 91/110]
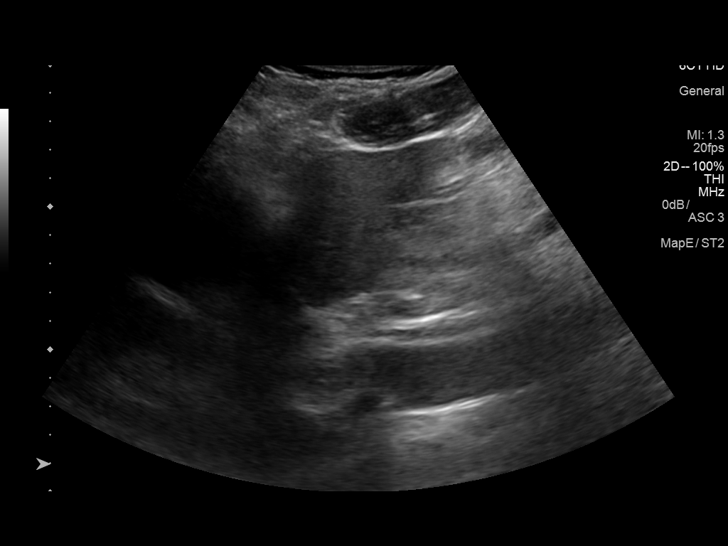
[im 100/110]
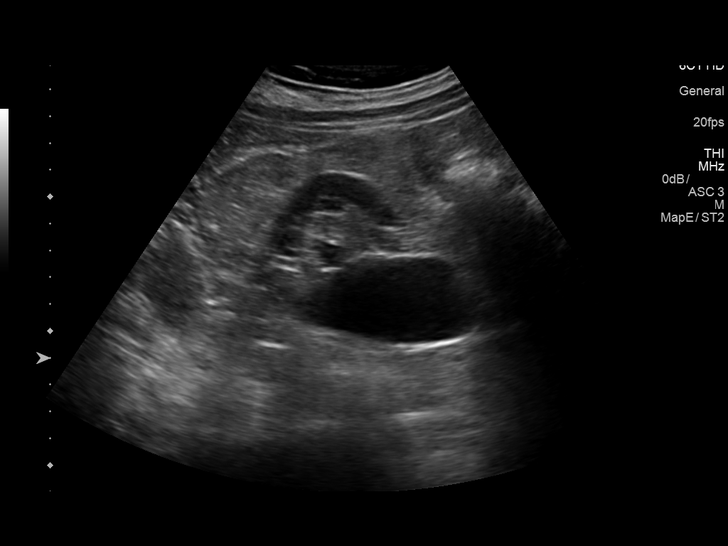
[im 110/110]
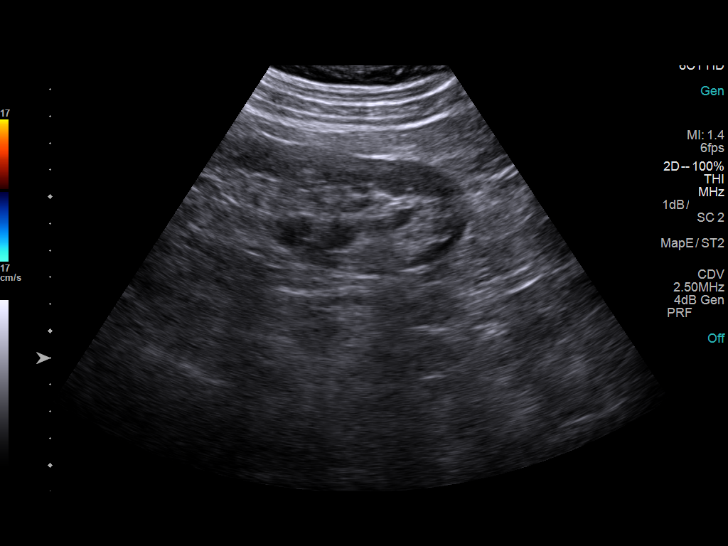

[14 of 25 positions shown; findings below may reference images not displayed]

FINDINGS: Gallbladder: No stones. No wall thickening. No pericholecystic
fluid. Sonographer reports no sonographic Murphy's sign.

Common bile duct:  Normal in caliber, 2.1mm diameter.

Liver: Somewhat heterogeneous and echogenic parenchyma without focal
lesion or biliary ductal dilatation. Antegrade color flow signal in
the main portal vein.

IVC:  Negative

Pancreas:  Negative

Spleen:  No focal lesion, craniocaudal 9.0cm in length.

Right Kidney: Dilatation of the extrarenal collecting system. No
focal renal lesion. 11.8cm in length.

Left Kidney: Mild distention of the central renal collecting system.
No focal renal lesion. 13.7cm in length.

Abdominal aorta:  Negative
IMPRESSION: 1. Echogenic liver parenchyma, a nonspecific finding often
associated with fatty infiltration.
2. No gallstones
3. Distended renal collecting systems,   right greater than left.

## 2020-02-08 ENCOUNTER — Ambulatory Visit: Payer: Managed Care, Other (non HMO) | Attending: Internal Medicine

## 2020-02-08 DIAGNOSIS — Z23 Encounter for immunization: Secondary | ICD-10-CM

## 2020-02-08 NOTE — Progress Notes (Signed)
   Covid-19 Vaccination Clinic  Name:  Jacob Garcia    MRN: [id number] DOB: 07/24/1956  02/08/2020  Jacob Garcia was observed post Covid-19 immunization for 15 minutes without incident. He was provided with Vaccine Information Sheet and instruction to access the V-Safe system.   Jacob Garcia was instructed to call [phone] with any severe reactions post vaccine: Marland Kitchen Difficulty breathing  . Swelling of face and throat  . A fast heartbeat  . A bad rash all over body  . Dizziness and weakness   Immunizations Administered    Name Date Dose VIS Date Route   Pfizer COVID-19 Vaccine 02/08/2020 10:00 AM 0.3 mL 10/25/2019 Intramuscular   Manufacturer: [place]   Lot: [id number]   [place]: [id number]

## 2020-03-03 ENCOUNTER — Ambulatory Visit: Payer: Managed Care, Other (non HMO) | Attending: Internal Medicine

## 2020-03-03 DIAGNOSIS — Z23 Encounter for immunization: Secondary | ICD-10-CM

## 2020-03-03 NOTE — Progress Notes (Signed)
   Covid-19 Vaccination Clinic  Name:  Jacob Garcia    MRN: [id number] DOB: 09-07-1956  03/03/2020  [name] was observed post Covid-19 immunization for 15 minutes without incident. He was provided with Vaccine Information Sheet and instruction to access the V-Safe system.   [name] was instructed to call 911 with any severe reactions post vaccine: Marland Kitchen Difficulty breathing  . Swelling of face and throat  . A fast heartbeat  . A bad rash all over body  . Dizziness and weakness   Immunizations Administered    Name Date Dose VIS Date Route   Pfizer COVID-19 Vaccine 03/03/2020  4:12 PM 0.3 mL 01/08/2019 Intramuscular   Manufacturer: [place]   Lot: [id number]   [place]: [id number]

## 2023-09-04 DIAGNOSIS — M533 Sacrococcygeal disorders, not elsewhere classified: Secondary | ICD-10-CM | POA: Diagnosis not present

## 2023-09-11 ENCOUNTER — Other Ambulatory Visit: Payer: Self-pay | Admitting: Family Medicine

## 2023-09-11 DIAGNOSIS — Z0000 Encounter for general adult medical examination without abnormal findings: Secondary | ICD-10-CM | POA: Diagnosis not present

## 2023-09-11 DIAGNOSIS — Z87891 Personal history of nicotine dependence: Secondary | ICD-10-CM

## 2023-09-11 DIAGNOSIS — R3915 Urgency of urination: Secondary | ICD-10-CM | POA: Diagnosis not present

## 2023-09-11 DIAGNOSIS — Z23 Encounter for immunization: Secondary | ICD-10-CM | POA: Diagnosis not present

## 2023-09-11 DIAGNOSIS — E7800 Pure hypercholesterolemia, unspecified: Secondary | ICD-10-CM | POA: Diagnosis not present

## 2023-09-11 DIAGNOSIS — M5441 Lumbago with sciatica, right side: Secondary | ICD-10-CM | POA: Diagnosis not present

## 2023-09-14 ENCOUNTER — Ambulatory Visit
Admission: RE | Admit: 2023-09-14 | Discharge: 2023-09-14 | Disposition: A | Discharge status: Home / Self Care | Payer: Medicare Other | Source: Ambulatory Visit | Attending: Family Medicine | Admitting: Family Medicine

## 2023-09-14 DIAGNOSIS — Z87891 Personal history of nicotine dependence: Secondary | ICD-10-CM

## 2023-09-14 DIAGNOSIS — Z136 Encounter for screening for cardiovascular disorders: Secondary | ICD-10-CM | POA: Diagnosis not present

## 2023-09-26 DIAGNOSIS — M5431 Sciatica, right side: Secondary | ICD-10-CM | POA: Diagnosis not present

## 2023-09-26 DIAGNOSIS — M533 Sacrococcygeal disorders, not elsewhere classified: Secondary | ICD-10-CM | POA: Diagnosis not present

## 2023-09-29 ENCOUNTER — Other Ambulatory Visit: Payer: Self-pay | Admitting: Sports Medicine

## 2023-09-29 DIAGNOSIS — M5431 Sciatica, right side: Secondary | ICD-10-CM

## 2023-09-29 DIAGNOSIS — M533 Sacrococcygeal disorders, not elsewhere classified: Secondary | ICD-10-CM

## 2023-10-05 DIAGNOSIS — M5431 Sciatica, right side: Secondary | ICD-10-CM | POA: Diagnosis not present

## 2023-10-05 DIAGNOSIS — M6281 Muscle weakness (generalized): Secondary | ICD-10-CM | POA: Diagnosis not present

## 2023-10-05 DIAGNOSIS — M5416 Radiculopathy, lumbar region: Secondary | ICD-10-CM | POA: Diagnosis not present

## 2023-10-06 ENCOUNTER — Encounter: Payer: Self-pay | Admitting: *Deleted

## 2023-10-09 DIAGNOSIS — M5431 Sciatica, right side: Secondary | ICD-10-CM | POA: Diagnosis not present

## 2023-10-09 DIAGNOSIS — M6281 Muscle weakness (generalized): Secondary | ICD-10-CM | POA: Diagnosis not present

## 2023-10-09 DIAGNOSIS — M5416 Radiculopathy, lumbar region: Secondary | ICD-10-CM | POA: Diagnosis not present

## 2023-10-19 DIAGNOSIS — M6281 Muscle weakness (generalized): Secondary | ICD-10-CM | POA: Diagnosis not present

## 2023-10-19 DIAGNOSIS — M5416 Radiculopathy, lumbar region: Secondary | ICD-10-CM | POA: Diagnosis not present

## 2023-10-19 DIAGNOSIS — M5431 Sciatica, right side: Secondary | ICD-10-CM | POA: Diagnosis not present

## 2023-10-26 DIAGNOSIS — M5416 Radiculopathy, lumbar region: Secondary | ICD-10-CM | POA: Diagnosis not present

## 2023-10-26 DIAGNOSIS — M5431 Sciatica, right side: Secondary | ICD-10-CM | POA: Diagnosis not present

## 2023-10-26 DIAGNOSIS — M6281 Muscle weakness (generalized): Secondary | ICD-10-CM | POA: Diagnosis not present

## 2023-10-30 DIAGNOSIS — M6281 Muscle weakness (generalized): Secondary | ICD-10-CM | POA: Diagnosis not present

## 2023-10-30 DIAGNOSIS — M5431 Sciatica, right side: Secondary | ICD-10-CM | POA: Diagnosis not present

## 2023-10-30 DIAGNOSIS — M5416 Radiculopathy, lumbar region: Secondary | ICD-10-CM | POA: Diagnosis not present

## 2023-11-10 DIAGNOSIS — M5416 Radiculopathy, lumbar region: Secondary | ICD-10-CM | POA: Diagnosis not present

## 2023-11-10 DIAGNOSIS — M6281 Muscle weakness (generalized): Secondary | ICD-10-CM | POA: Diagnosis not present

## 2023-11-10 DIAGNOSIS — M5431 Sciatica, right side: Secondary | ICD-10-CM | POA: Diagnosis not present

## 2023-11-13 DIAGNOSIS — M533 Sacrococcygeal disorders, not elsewhere classified: Secondary | ICD-10-CM | POA: Diagnosis not present

## 2024-04-16 DIAGNOSIS — H43392 Other vitreous opacities, left eye: Secondary | ICD-10-CM | POA: Diagnosis not present

## 2024-04-16 DIAGNOSIS — H43812 Vitreous degeneration, left eye: Secondary | ICD-10-CM | POA: Diagnosis not present

## 2024-08-09 DIAGNOSIS — N390 Urinary tract infection, site not specified: Secondary | ICD-10-CM | POA: Diagnosis not present

## 2024-09-11 DIAGNOSIS — Z23 Encounter for immunization: Secondary | ICD-10-CM | POA: Diagnosis not present

## 2024-09-11 DIAGNOSIS — Z87891 Personal history of nicotine dependence: Secondary | ICD-10-CM | POA: Diagnosis not present

## 2024-09-11 DIAGNOSIS — Z131 Encounter for screening for diabetes mellitus: Secondary | ICD-10-CM | POA: Diagnosis not present

## 2024-09-11 DIAGNOSIS — Z0000 Encounter for general adult medical examination without abnormal findings: Secondary | ICD-10-CM | POA: Diagnosis not present

## 2024-09-11 DIAGNOSIS — R300 Dysuria: Secondary | ICD-10-CM | POA: Diagnosis not present

## 2024-09-11 DIAGNOSIS — H9190 Unspecified hearing loss, unspecified ear: Secondary | ICD-10-CM | POA: Diagnosis not present

## 2024-09-11 DIAGNOSIS — Z860102 Personal history of hyperplastic colon polyps: Secondary | ICD-10-CM | POA: Diagnosis not present

## 2024-09-11 DIAGNOSIS — E7800 Pure hypercholesterolemia, unspecified: Secondary | ICD-10-CM | POA: Diagnosis not present

## 2024-09-25 ENCOUNTER — Telehealth: Payer: Self-pay | Admitting: Acute Care

## 2024-09-25 DIAGNOSIS — Z122 Encounter for screening for malignant neoplasm of respiratory organs: Secondary | ICD-10-CM

## 2024-09-25 DIAGNOSIS — Z87891 Personal history of nicotine dependence: Secondary | ICD-10-CM

## 2024-09-25 NOTE — Telephone Encounter (Signed)
 Lung Cancer Screening Narrative/Criteria Questionnaire (Cigarette Smokers Only- No Cigars/Pipes/vapes)   Jacob Garcia   SDMV:10/03/2024 10:00 [name]     [birth year]   LDCT: 10/04/2024 10:40 GI    68 y.o.   Phone: [phone]  Lung Screening Narrative (confirm age 41-77 yrs Medicare / 50-80 yrs Private pay insurance)   Insurance information:[employer] mcr   Referring Provider:Dr. Olam Pinal    This screening involves an initial phone call with a team member from our program. It is called a shared decision making visit. The initial meeting is required by  insurance and Medicare to make sure you understand the program. This appointment takes about 15-20 minutes to complete. You will complete the screening scan at your scheduled date/time.  This scan takes about 5-10 minutes to complete. You can eat and drink normally before and after the scan.  Criteria questions for Lung Cancer Screening:   Are you a current or former smoker? Former Age began smoking: 68yo   If you are a former smoker, what year did you quit smoking? Quit for a span of 12 years then started again. Patient has final quit date of 2020. (within 15 yrs)   To calculate your smoking history, I need an accurate estimate of how many packs of cigarettes you smoked per day and for how many years. (Not just the number of PPD you are now smoking)   Years smoking 41 x Packs per day 1/2 = Pack years 20.5   (at least 20 pack yrs)   (Make sure they understand that we need to know how much they have smoked in the past, not just the number of PPD they are smoking now)  Do you have a personal history of cancer?  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 Yes  (cancer type and and relative) mother - lung  Are you coughing up blood?  No  Have you had unexplained weight loss of 15 lbs or more in the last 6 months? No  It looks like  you meet all criteria.  When would be a good time for us  to schedule you for this screening?   Additional information: N/A

## 2024-10-03 ENCOUNTER — Ambulatory Visit: Admitting: *Deleted

## 2024-10-03 ENCOUNTER — Encounter: Payer: Self-pay | Admitting: *Deleted

## 2024-10-03 DIAGNOSIS — Z87891 Personal history of nicotine dependence: Secondary | ICD-10-CM | POA: Diagnosis not present

## 2024-10-03 NOTE — Progress Notes (Signed)
  Virtual Visit via Telephone Note  I connected with Jacob Garcia on 10/03/24 at 10:00 AM EST by telephone and verified that I am speaking with the correct person using two identifiers.  Location: Patient: at home Provider: [address], [place], [place], [address]    I discussed the limitations, risks, security and privacy concerns of performing an evaluation and management service by telephone and the availability of in person appointments. I also discussed with the patient that there may be a patient responsible charge related to this service. The patient expressed understanding and agreed to proceed.   Shared Decision Making Visit Lung Cancer Screening Program [phone])   Eligibility: Age 68 y.o. Pack Years Smoking History Calculation 20 (# packs/per year x # years smoked) Recent History of coughing up blood  no Unexplained weight loss? no ( >Than 15 pounds within the last 6 months ) Prior History Lung / other cancer no (Diagnosis within the last 5 years already requiring surveillance chest CT Scans). Smoking Status Former Smoker Former Smokers: Years since quit: 5 years  Quit Date: 2020  Visit Components: Discussion included one or more decision making aids. yes Discussion included risk/benefits of screening. yes Discussion included potential follow up diagnostic testing for abnormal scans. yes Discussion included meaning and risk of over diagnosis. yes Discussion included meaning and risk of False Positives. yes Discussion included meaning of total radiation exposure. yes  Counseling Included: Importance of adherence to annual lung cancer LDCT screening. yes Impact of comorbidities on ability to participate in the program. yes Ability and willingness to under diagnostic treatment. yes  Smoking Cessation Counseling: Current Smokers:  Discussed importance of smoking cessation. yes Information about tobacco cessation classes and interventions provided to  patient. yes Patient provided with ticket for LDCT Scan. no Symptomatic Patient. no  Diagnosis Code: Tobacco Use Z72.0 Asymptomatic Patient yes Former Smokers:  Discussed the importance of maintaining cigarette abstinence. yes Diagnosis Code: Personal History of Nicotine Dependence. S12.108 Information about tobacco cessation classes and interventions provided to patient. Yes Patient provided with ticket for LDCT Scan. no Written Order for Lung Cancer Screening with LDCT placed in Epic. Yes (CT Chest Lung Cancer Screening Low Dose W/O CM) [id number] Z12.2-Screening of respiratory organs Z87.891-Personal history of nicotine dependence   Laneta Speaks, RN

## 2024-10-03 NOTE — Patient Instructions (Signed)

## 2024-10-04 ENCOUNTER — Ambulatory Visit
Admission: RE | Admit: 2024-10-04 | Discharge: 2024-10-04 | Disposition: A | Discharge status: Home / Self Care | Source: Ambulatory Visit | Attending: Acute Care | Admitting: Acute Care

## 2024-10-04 DIAGNOSIS — Z87891 Personal history of nicotine dependence: Secondary | ICD-10-CM

## 2024-10-04 DIAGNOSIS — Z122 Encounter for screening for malignant neoplasm of respiratory organs: Secondary | ICD-10-CM

## 2024-10-09 ENCOUNTER — Other Ambulatory Visit: Payer: Self-pay

## 2024-10-09 DIAGNOSIS — Z87891 Personal history of nicotine dependence: Secondary | ICD-10-CM

## 2024-10-09 DIAGNOSIS — Z122 Encounter for screening for malignant neoplasm of respiratory organs: Secondary | ICD-10-CM
